# Patient Record
Sex: Female | Born: 1980 | Hispanic: Refuse to answer | Marital: Married | Smoking: Former smoker
Health system: Southern US, Community
[De-identification: ages and names within clinical notes are randomized; demographics above are authoritative.]

## PROBLEM LIST (undated history)

## (undated) DIAGNOSIS — F419 Anxiety disorder, unspecified: Secondary | ICD-10-CM

## (undated) DIAGNOSIS — F329 Major depressive disorder, single episode, unspecified: Secondary | ICD-10-CM

## (undated) DIAGNOSIS — O09299 Supervision of pregnancy with other poor reproductive or obstetric history, unspecified trimester: Secondary | ICD-10-CM

## (undated) DIAGNOSIS — F32A Depression, unspecified: Secondary | ICD-10-CM

## (undated) DIAGNOSIS — R011 Cardiac murmur, unspecified: Secondary | ICD-10-CM

## (undated) HISTORY — PX: CARPAL TUNNEL RELEASE: SHX101

## (undated) HISTORY — PX: APPENDECTOMY: SHX54

---
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
## 2020-03-01 LAB — GLUCOSE, FASTING
Glucose Fasting: 78
Hemoglobin A1C: 5
Lead: <1
Progesterone: 21.2
T3, Free: 5
T3, Total: 18.6
T4, Free Direct Dialysis: 2.9

## 2020-03-01 LAB — OB RESULTS CONSOLE RUBELLA ANTIBODY, IGM: Rubella: IMMUNE

## 2020-03-01 LAB — OB RESULTS CONSOLE HGB/HCT, BLOOD
HCT: 40 (ref 29–41)
Hemoglobin: 13.6

## 2020-03-01 LAB — OB RESULTS CONSOLE PLATELET COUNT: Platelets: 293

## 2020-03-01 LAB — OB RESULTS CONSOLE HIV ANTIBODY (ROUTINE TESTING): HIV: NONREACTIVE

## 2020-03-01 LAB — OB RESULTS CONSOLE TSH: TSH: 0.02

## 2020-03-01 LAB — OB RESULTS CONSOLE HEPATITIS B SURFACE ANTIGEN: Hepatitis B Surface Ag: NEGATIVE

## 2020-03-01 LAB — OB RESULTS CONSOLE ABO/RH: RH Type: POSITIVE

## 2020-03-01 LAB — OB RESULTS CONSOLE RPR: RPR: NONREACTIVE

## 2020-03-01 LAB — OB RESULTS CONSOLE VARICELLA ZOSTER ANTIBODY, IGG: Varicella: IMMUNE

## 2020-03-01 LAB — OB RESULTS CONSOLE ANTIBODY SCREEN: Antibody Screen: NEGATIVE

## 2020-04-29 LAB — OB RESULTS CONSOLE GC/CHLAMYDIA
Chlamydia: NEGATIVE
Gonorrhea: NEGATIVE

## 2020-06-06 ENCOUNTER — Encounter (HOSPITAL_COMMUNITY): Payer: Self-pay | Admitting: Obstetrics & Gynecology

## 2020-06-06 ENCOUNTER — Inpatient Hospital Stay (HOSPITAL_COMMUNITY)
Admission: EM | Admit: 2020-06-06 | Discharge: 2020-06-06 | Disposition: A | Discharge status: Home / Self Care | Payer: Medicaid Other | Attending: Obstetrics & Gynecology | Admitting: Obstetrics & Gynecology

## 2020-06-06 ENCOUNTER — Telehealth (INDEPENDENT_AMBULATORY_CARE_PROVIDER_SITE_OTHER): Payer: Medicaid Other | Admitting: Obstetrics and Gynecology

## 2020-06-06 ENCOUNTER — Other Ambulatory Visit: Payer: Self-pay

## 2020-06-06 ENCOUNTER — Encounter: Payer: Self-pay | Admitting: *Deleted

## 2020-06-06 DIAGNOSIS — O99342 Other mental disorders complicating pregnancy, second trimester: Secondary | ICD-10-CM | POA: Insufficient documentation

## 2020-06-06 DIAGNOSIS — O99891 Other specified diseases and conditions complicating pregnancy: Secondary | ICD-10-CM | POA: Diagnosis not present

## 2020-06-06 DIAGNOSIS — Z79899 Other long term (current) drug therapy: Secondary | ICD-10-CM | POA: Diagnosis not present

## 2020-06-06 DIAGNOSIS — O0990 Supervision of high risk pregnancy, unspecified, unspecified trimester: Secondary | ICD-10-CM

## 2020-06-06 DIAGNOSIS — O10919 Unspecified pre-existing hypertension complicating pregnancy, unspecified trimester: Secondary | ICD-10-CM

## 2020-06-06 DIAGNOSIS — Z3492 Encounter for supervision of normal pregnancy, unspecified, second trimester: Secondary | ICD-10-CM

## 2020-06-06 DIAGNOSIS — O0932 Supervision of pregnancy with insufficient antenatal care, second trimester: Secondary | ICD-10-CM | POA: Diagnosis not present

## 2020-06-06 DIAGNOSIS — M549 Dorsalgia, unspecified: Secondary | ICD-10-CM | POA: Diagnosis not present

## 2020-06-06 DIAGNOSIS — O09299 Supervision of pregnancy with other poor reproductive or obstetric history, unspecified trimester: Secondary | ICD-10-CM

## 2020-06-06 DIAGNOSIS — O368120 Decreased fetal movements, second trimester, not applicable or unspecified: Secondary | ICD-10-CM

## 2020-06-06 DIAGNOSIS — Z3A22 22 weeks gestation of pregnancy: Secondary | ICD-10-CM | POA: Diagnosis not present

## 2020-06-06 DIAGNOSIS — O26892 Other specified pregnancy related conditions, second trimester: Secondary | ICD-10-CM | POA: Insufficient documentation

## 2020-06-06 DIAGNOSIS — O09292 Supervision of pregnancy with other poor reproductive or obstetric history, second trimester: Secondary | ICD-10-CM | POA: Diagnosis not present

## 2020-06-06 DIAGNOSIS — B3731 Acute candidiasis of vulva and vagina: Secondary | ICD-10-CM

## 2020-06-06 DIAGNOSIS — O99322 Drug use complicating pregnancy, second trimester: Secondary | ICD-10-CM

## 2020-06-06 DIAGNOSIS — F419 Anxiety disorder, unspecified: Secondary | ICD-10-CM | POA: Insufficient documentation

## 2020-06-06 DIAGNOSIS — O98812 Other maternal infectious and parasitic diseases complicating pregnancy, second trimester: Secondary | ICD-10-CM | POA: Insufficient documentation

## 2020-06-06 DIAGNOSIS — M545 Low back pain: Secondary | ICD-10-CM | POA: Insufficient documentation

## 2020-06-06 DIAGNOSIS — B373 Candidiasis of vulva and vagina: Secondary | ICD-10-CM | POA: Diagnosis not present

## 2020-06-06 DIAGNOSIS — Z87891 Personal history of nicotine dependence: Secondary | ICD-10-CM | POA: Insufficient documentation

## 2020-06-06 DIAGNOSIS — F329 Major depressive disorder, single episode, unspecified: Secondary | ICD-10-CM | POA: Diagnosis not present

## 2020-06-06 DIAGNOSIS — Z8759 Personal history of other complications of pregnancy, childbirth and the puerperium: Secondary | ICD-10-CM | POA: Insufficient documentation

## 2020-06-06 DIAGNOSIS — O368130 Decreased fetal movements, third trimester, not applicable or unspecified: Secondary | ICD-10-CM | POA: Diagnosis not present

## 2020-06-06 DIAGNOSIS — O09522 Supervision of elderly multigravida, second trimester: Secondary | ICD-10-CM

## 2020-06-06 HISTORY — DX: Depression, unspecified: F32.A

## 2020-06-06 HISTORY — DX: Supervision of pregnancy with other poor reproductive or obstetric history, unspecified trimester: O09.299

## 2020-06-06 HISTORY — DX: Cardiac murmur, unspecified: R01.1

## 2020-06-06 HISTORY — DX: Anxiety disorder, unspecified: F41.9

## 2020-06-06 LAB — URINALYSIS, ROUTINE W REFLEX MICROSCOPIC
Bacteria, UA: NONE SEEN
Bilirubin Urine: NEGATIVE
Glucose, UA: NEGATIVE mg/dL
Ketones, ur: NEGATIVE mg/dL
Leukocytes,Ua: NEGATIVE
Nitrite: NEGATIVE
Protein, ur: NEGATIVE mg/dL
Specific Gravity, Urine: 1.016 (ref 1.005–1.030)
pH: 6 (ref 5.0–8.0)

## 2020-06-06 LAB — WET PREP, GENITAL
Clue Cells Wet Prep HPF POC: NONE SEEN
Sperm: NONE SEEN
Trich, Wet Prep: NONE SEEN

## 2020-06-06 MED ORDER — TERCONAZOLE 0.4 % VA CREA: 1.0000 | TOPICAL_CREAM | Freq: Every day | VAGINAL | 0 refills | Status: AC

## 2020-06-06 MED ORDER — COMFORT FIT MATERNITY SUPP SM MISC: 1.0000 [IU] | Freq: Every day | 0 refills | Status: AC | PRN

## 2020-06-06 MED ORDER — ACETAMINOPHEN 500 MG PO TABS
1000.0000 mg | ORAL_TABLET | Freq: Once | ORAL | Status: AC
  Administered 2020-06-06: 1000 mg via ORAL
  Filled 2020-06-06: qty 2

## 2020-06-06 MED ORDER — CYCLOBENZAPRINE HCL 5 MG PO TABS
10.0000 mg | ORAL_TABLET | Freq: Once | ORAL | Status: AC
  Administered 2020-06-06: 10 mg via ORAL
  Filled 2020-06-06: qty 2

## 2020-06-06 MED ORDER — CYCLOBENZAPRINE HCL 5 MG PO TABS: 5.0000 mg | ORAL_TABLET | Freq: Three times a day (TID) | ORAL | 0 refills | Status: AC | PRN

## 2020-06-06 NOTE — ED Notes (Signed)
Patient is scared to sit inside ED waiting area. Informed her that transport will be arriving momentarily and if she is outside she will miss her name being called.... Patient states she would like to "check herself out" of the ED and walk over to MAU. Advised patient against this.... Patient is inside ED lobby and is waiting for transport.

## 2020-06-06 NOTE — MAU Provider Note (Signed)
Chief Complaint: Decreased Fetal Movement and Back Pain   First Provider Initiated Contact with Patient 06/06/20 2119     SUBJECTIVE HPI: Yvette Murphy is a 39 y.o. [id number] at [redacted]w[redacted]d who presents to Maternity Admissions reporting decreased fetal movement and back pain.  Recently moved here from [place] and has an appointment with her new OB later this month. Normally feels her baby every time she eats, but has not felt movement at all today.  Also reports constant low back pain today.  Has noticed an increase in discharge that looks like yeast to her.  No vaginal irritation.  Denies urinary complaints or vaginal bleeding.  Location: back Quality: aching Severity: 4/10 on pain scale Duration: 1 day Timing: constant Modifying factors: none Associated signs and symptoms: vaginal discharge  Past Medical History:  Diagnosis Date  . Anxiety and depression   . Heart murmur   . Hx of preeclampsia, prior pregnancy, currently pregnant    OB History  Gravida Para Term Preterm AB Living  5 3 3  0 1 3  SAB TAB Ectopic Multiple Live Births  1 0 0 0 3    # Outcome Date GA Lbr Len/2nd Weight Sex Delivery Anes PTL Lv  5 Current           4 SAB  [redacted]w[redacted]d         3 Term      Vag-Spont     2 Term      Vag-Spont     1 Term      Vag-Spont      Past Surgical History:  Procedure Laterality Date  . APPENDECTOMY    . CARPAL TUNNEL RELEASE Right    Social History   Socioeconomic History  . Marital status: Married    Spouse name: Not on file  . Number of children: Not on file  . Years of education: Not on file  . Highest education level: Not on file  Occupational History  . Not on file  Tobacco Use  . Smoking status: Former [redacted]w[redacted]d  . Smokeless tobacco: Never Used  Substance and Sexual Activity  . Alcohol use: Not Currently    Comment: socially  . Drug use: Not Currently    Types: Marijuana    Comment: used medical marijuana prior to pregnancy  . Sexual activity: Not Currently  Other Topics  Concern  . Not on file  Social History Narrative  . Not on file   Social Determinants of Health   Financial Resource Strain:   . Difficulty of Paying Living Expenses:   Food Insecurity:   . Worried About Games developer in the Last Year:   . Programme researcher, broadcasting/film/video in the Last Year:   Transportation Needs:   . Barista (Medical):   Freight forwarder Lack of Transportation (Non-Medical):   Physical Activity:   . Days of Exercise per Week:   . Minutes of Exercise per Session:   Stress:   . Feeling of Stress :   Social Connections:   . Frequency of Communication with Friends and Family:   . Frequency of Social Gatherings with Friends and Family:   . Attends Religious Services:   . Active Member of Clubs or Organizations:   . Attends Marland Kitchen Meetings:   Banker Marital Status:   Intimate Partner Violence:   . Fear of Current or Ex-Partner:   . Emotionally Abused:   Marland Kitchen Physically Abused:   . Sexually Abused:    Family  History  Problem Relation Age of Onset  . Diabetes Father   . CVA Father   . Hypertension Father    No current facility-administered medications on file prior to encounter.   Current Outpatient Medications on File Prior to Encounter  Medication Sig Dispense Refill  . acetaminophen (TYLENOL) 500 MG tablet Take 1,000 mg by mouth every 6 (six) hours as needed.    Marland Kitchen buPROPion (WELLBUTRIN XL) 150 MG 24 hr tablet Take 150 mg by mouth daily.    Marland [employer] doxylamine, Sleep, (UNISOM) 25 MG tablet Take 25 mg by mouth at bedtime as needed.    . Prenatal Vit-Fe Fumarate-FA (PRENATAL MULTIVITAMIN) TABS tablet Take 1 tablet by mouth daily at 12 noon.    . pyridOXINE (VITAMIN B-6) 100 MG tablet Take 100 mg by mouth daily.     Allergies  Allergen Reactions  . Codeine Hives    I have reviewed patient's Past Medical Hx, Surgical Hx, Family Hx, Social Hx, medications and allergies.   Review of Systems  Constitutional: Negative.   Gastrointestinal: Negative.   Genitourinary:  Positive for vaginal discharge. Negative for dysuria, flank pain and vaginal bleeding.  Musculoskeletal: Positive for back pain.    OBJECTIVE Patient Vitals for the past 24 hrs:  BP Temp Temp src Pulse Resp SpO2 Height Weight  06/06/20 2237 96/72 -- -- 86 18 -- -- --  06/06/20 2017 107/77 98.5 F (36.9 C) Oral 94 18 100 % 5\' 1"  (1.549 m) 69.9 kg  06/06/20 1840 114/81 98.2 F (36.8 C) -- 97 18 98 % -- --   Constitutional: Well-developed, well-nourished female in no acute distress.  Cardiovascular: normal rate & rhythm, no murmur Respiratory: normal rate and effort. Lung sounds clear throughout GI: No CVAT.  Abd soft, non-tender, Pos BS x 4. No guarding or rebound tenderness MS: Extremities nontender, no edema, normal ROM Neurologic: Alert and oriented x 4.  GU:  NEFG. Clumpy white discharge. No blood. Cervix closed/thick.    LAB RESULTS Results for orders placed or performed during the hospital encounter of 06/06/20 (from the past 24 hour(s))  Urinalysis, Routine w reflex microscopic     Status: Abnormal   Collection Time: 06/06/20  8:46 PM  Result Value Ref Range   Color, Urine YELLOW YELLOW   APPearance CLEAR CLEAR   Specific Gravity, Urine 1.016 1.005 - 1.030   pH 6.0 5.0 - 8.0   Glucose, UA NEGATIVE NEGATIVE mg/dL   Hgb urine dipstick MODERATE (A) NEGATIVE   Bilirubin Urine NEGATIVE NEGATIVE   Ketones, ur NEGATIVE NEGATIVE mg/dL   Protein, ur NEGATIVE NEGATIVE mg/dL   Nitrite NEGATIVE NEGATIVE   Leukocytes,Ua NEGATIVE NEGATIVE   RBC / HPF 21-50 0 - 5 RBC/hpf   WBC, UA 0-5 0 - 5 WBC/hpf   Bacteria, UA NONE SEEN NONE SEEN   Squamous Epithelial / LPF 0-5 0 - 5   Mucus PRESENT   Wet prep, genital     Status: Abnormal   Collection Time: 06/06/20  9:36 PM   Specimen: Vaginal  Result Value Ref Range   Yeast Wet Prep HPF POC PRESENT (A) NONE SEEN   Trich, Wet Prep NONE SEEN NONE SEEN   Clue Cells Wet Prep HPF POC NONE SEEN NONE SEEN   WBC, Wet Prep HPF POC MANY (A) NONE  SEEN   Sperm NONE SEEN     IMAGING No results found.  MAU COURSE Orders Placed This Encounter  Procedures  . Culture, OB Urine  . Wet prep,  genital  . Urinalysis, Routine w reflex microscopic  . POC urine preg, ED  . Discharge patient   Meds ordered this encounter  Medications  . acetaminophen (TYLENOL) tablet 1,000 mg  . cyclobenzaprine (FLEXERIL) tablet 10 mg  . terconazole (TERAZOL 7) 0.4 % vaginal cream    Sig: Place 1 applicator vaginally at bedtime. Use for seven days    Dispense:  45 g    Refill:  0    Order Specific Question:   Supervising Provider    Answer:   Adam PhenixARNOLD, JAMES G [[id number]]  . cyclobenzaprine (FLEXERIL) 5 MG tablet    Sig: Take 1 tablet (5 mg total) by mouth 3 (three) times daily as needed (back pain).    Dispense:  20 tablet    Refill:  0    Order Specific Question:   Supervising Provider    Answer:   Adam PhenixARNOLD, JAMES G [[id number]]  . Elastic Bandages & Supports (COMFORT FIT MATERNITY SUPP SM) MISC    Sig: 1 Units by Does not apply route daily as needed.    Dispense:  1 each    Refill:  0    Order Specific Question:   Supervising Provider    Answer:   Adam PhenixARNOLD, JAMES G [[id number]]    MDM FHT present via doppler & patient endorses movement in MAU. Pt reassured.   Low back pain & pelvic pressure. Cervix closed/thick. No CVA tenderness or flank pain. U/a with hemoglobin, no leuks. Will culture.  Wet prep collected & is positive for yeast; will rx terazol.   Tylenol, flexeril, & hot pack given for back pain. Likely MSK. Recommend maternity support belt & given rx to [facility] one at [facility].   ASSESSMENT 1. Back pain affecting pregnancy in second trimester   2. Vaginal yeast infection   3. Decreased fetal movements in second trimester, single or unspecified fetus   4. [redacted] weeks gestation of pregnancy   5. Presence of fetal heart sounds in second trimester     PLAN Discharge home in stable condition. Rx flexeril, terazol, & maternity support belt  GC/CT & urine  culture pending Discussed reasons to return to MAU Informed of upcoming appointments   Follow-up Information    [facility] Follow up.   Why: go to get maternity support belt Contact information: [address] [address] [phone]              Allergies as of 06/06/2020      Reactions   Codeine Hives      Medication List    TAKE these medications   acetaminophen 500 MG tablet Commonly known as: TYLENOL Take 1,000 mg by mouth every 6 (six) hours as needed.   buPROPion 150 MG 24 hr tablet Commonly known as: WELLBUTRIN XL Take 150 mg by mouth daily.   Comfort Fit Maternity Supp Sm Misc 1 Units by Does not apply route daily as needed.   cyclobenzaprine 5 MG tablet Commonly known as: FLEXERIL Take 1 tablet (5 mg total) by mouth 3 (three) times daily as needed (back pain).   doxylamine (Sleep) 25 MG tablet Commonly known as: UNISOM Take 25 mg by mouth at bedtime as needed.   prenatal multivitamin Tabs tablet Take 1 tablet by mouth daily at 12 noon.   pyridOXINE 100 MG tablet Commonly known as: VITAMIN B-6 Take 100 mg by mouth daily.   terconazole 0.4 % vaginal cream Commonly known as: TERAZOL 7 Place 1 applicator vaginally  at bedtime. Use for seven days        Judeth Horn, NP 06/06/2020  11:56 PM

## 2020-06-06 NOTE — Telephone Encounter (Addendum)
Called pt to discuss her request and she did not answer. Message left on VM stating that I will call back. Per review, pt is a transfer of care and is scheduled for first office appt on 06/21/20. Records have been received and indicate pt has CHTN and substance abuse. EDD 10/07/20 by LMP 01/01/20 (now 22w 3d). [place] for anatomy ordered and scheduled on 8/5 @ 10:15.    8/26  1710  It was brought to my attention that pt had raised concerns regarding incorrect information contained in her record that she had history of substance abuse. (See my note on 8/2) Per extensive review of all records available, the history of substance abuse as well as CHTN could not be found. I called pt and extended my most sincere apology for this incorrect information in her record. I assured her that the information could not be found in the records we received from her previous provider. I also informed her that I would be adding this note stating that she does not have substance abuse or CHTN. Pt accepted my apology with grace and thanked me for reaching out to her regarding the situation. Pt added that she has moved back to [place] and will resume prenatal care with her previous provider. I advised that she may contact us if she has any additional questions or concerns. She voiced understanding.

## 2020-06-06 NOTE — MAU Note (Signed)
Pt transferred from [facility] with reports of decreased fetal movement, back pain, and pelvic pressure. Reports she hasn't felt baby move today. Tried eating and nothing. Reports back pain started around 3pm, pain is constant and cannot get comfortable. Denies urinary s/s, vaginal bleeding, LOF. Has some discharge, possible yeast infection.

## 2020-06-06 NOTE — Discharge Instructions (Signed)

## 2020-06-06 NOTE — ED Triage Notes (Signed)
Approx [redacted] weeks pregnant, EDD 10/07/2020. Reports dec fetal activity

## 2020-06-06 NOTE — Telephone Encounter (Signed)
Patient is scheduled for her first OB appointment on Aug 17th, patient is requesting a order to get her first Ultra Sound

## 2020-06-06 NOTE — ED Provider Notes (Signed)
MSE was initiated and I personally evaluated the patient and placed orders (if any) at  6:51 PM on June 06, 2020.  The patient appears stable so that the remainder of the MSE may be completed by another provider.  Patient placed in Quick Look pathway, seen and evaluated   Chief Complaint: Back pain and decreased fetal activity  HPI:   38 year old female who is currently pregnant at [redacted] weeks gestation based on LMP presenting to the ED for back pain, pressure sensation abdomen and decreased fetal activity today.  She recently moved from [place] to [place] and has not yet established prenatal care with an OB/GYN.  She denies any leakage of fluids, vaginal bleeding, chest pain, shortness of breath, injuries or falls.  ROS: Abdominal pressure (one)  Physical Exam:   Gen: No distress  Neuro: Awake and Alert  Skin: Warm    Focused Exam: No focal tenderness of the abdomen.  7:32 PM Patient concern for decreased fetal activity, back pain abdominal pressure today.  She is currently [redacted] weeks pregnant. HDS in triage. No documented pregnancy test in the system as she recently moved to [place] from [place].  POC urine pregnancy test ordered. MAU provider notified of transfer from ED.   Initiation of care has begun. The patient has been counseled on the process, plan, and necessity for staying for the completion/evaluation, and the remainder of the medical screening examination    Dietrich Pates, PA-C 06/06/20 1932    Jacalyn Lefevre, MD 06/06/20 [phone]

## 2020-06-06 NOTE — ED Notes (Signed)
Report called to MAU. Transport arranged

## 2020-06-07 LAB — GC/CHLAMYDIA PROBE AMP (~~LOC~~) NOT AT ARMC
Chlamydia: NEGATIVE
Comment: NEGATIVE
Comment: NORMAL
Neisseria Gonorrhea: NEGATIVE

## 2020-06-07 NOTE — Telephone Encounter (Signed)
Called patient & informed her of ultrasound appt. Patient verbalized understanding & states she was aware of the appt but wondered about a later time. Provided phone number to MFM for patient to call to reschedule. Patient asked about what papers to bring to the appt. Recommended driver's license and insurance card. Patient verbalized understanding.

## 2020-06-08 LAB — CULTURE, OB URINE

## 2020-06-09 ENCOUNTER — Ambulatory Visit: Payer: Medicaid Other | Attending: Obstetrics and Gynecology

## 2020-06-09 ENCOUNTER — Other Ambulatory Visit: Payer: Self-pay

## 2020-06-09 ENCOUNTER — Encounter: Payer: Self-pay | Admitting: *Deleted

## 2020-06-09 ENCOUNTER — Ambulatory Visit: Payer: Medicaid Other | Admitting: *Deleted

## 2020-06-09 ENCOUNTER — Other Ambulatory Visit: Payer: Self-pay | Admitting: *Deleted

## 2020-06-09 VITALS — BP 115/77 | HR 101

## 2020-06-09 DIAGNOSIS — O99322 Drug use complicating pregnancy, second trimester: Secondary | ICD-10-CM | POA: Insufficient documentation

## 2020-06-09 DIAGNOSIS — O09529 Supervision of elderly multigravida, unspecified trimester: Secondary | ICD-10-CM

## 2020-06-09 DIAGNOSIS — O0990 Supervision of high risk pregnancy, unspecified, unspecified trimester: Secondary | ICD-10-CM | POA: Diagnosis present

## 2020-06-09 DIAGNOSIS — O09522 Supervision of elderly multigravida, second trimester: Secondary | ICD-10-CM

## 2020-06-09 DIAGNOSIS — O09292 Supervision of pregnancy with other poor reproductive or obstetric history, second trimester: Secondary | ICD-10-CM

## 2020-06-09 DIAGNOSIS — Z362 Encounter for other antenatal screening follow-up: Secondary | ICD-10-CM

## 2020-06-09 DIAGNOSIS — O10919 Unspecified pre-existing hypertension complicating pregnancy, unspecified trimester: Secondary | ICD-10-CM | POA: Insufficient documentation

## 2020-06-09 DIAGNOSIS — Z8759 Personal history of other complications of pregnancy, childbirth and the puerperium: Secondary | ICD-10-CM | POA: Diagnosis not present

## 2020-06-09 DIAGNOSIS — Z363 Encounter for antenatal screening for malformations: Secondary | ICD-10-CM

## 2020-06-09 DIAGNOSIS — O322XX0 Maternal care for transverse and oblique lie, not applicable or unspecified: Secondary | ICD-10-CM

## 2020-06-09 DIAGNOSIS — Z3A22 22 weeks gestation of pregnancy: Secondary | ICD-10-CM

## 2020-06-13 ENCOUNTER — Encounter: Payer: Self-pay | Admitting: Lactation Services

## 2020-06-13 DIAGNOSIS — Z3402 Encounter for supervision of normal first pregnancy, second trimester: Secondary | ICD-10-CM | POA: Insufficient documentation

## 2020-06-13 DIAGNOSIS — O09522 Supervision of elderly multigravida, second trimester: Secondary | ICD-10-CM | POA: Insufficient documentation

## 2020-06-21 ENCOUNTER — Telehealth: Payer: Self-pay | Admitting: Obstetrics and Gynecology

## 2020-06-21 ENCOUNTER — Encounter: Payer: Self-pay | Admitting: Obstetrics and Gynecology

## 2020-06-21 NOTE — Telephone Encounter (Signed)
Spoke with patient to get her rescheduled for her missed new ob appointment. Patient stated to cancel the appointment and she wishes not to be rescheduled. Per patient request appointment was not rescheduled.

## 2020-06-27 ENCOUNTER — Encounter: Payer: Self-pay | Admitting: *Deleted

## 2020-06-27 ENCOUNTER — Other Ambulatory Visit: Payer: Self-pay | Admitting: Obstetrics and Gynecology

## 2020-06-27 DIAGNOSIS — O10919 Unspecified pre-existing hypertension complicating pregnancy, unspecified trimester: Secondary | ICD-10-CM

## 2020-06-27 DIAGNOSIS — O0990 Supervision of high risk pregnancy, unspecified, unspecified trimester: Secondary | ICD-10-CM

## 2020-07-07 ENCOUNTER — Ambulatory Visit: Payer: Medicaid Other

## 2021-09-22 IMAGING — US US MFM OB DETAIL+14 WK
1 series · 13 of 28 positions shown · non-contrast
Comparison: none

[Series 1: us mfm ob detail+14 wk · 96 acquisitions, 13 frames shown]
[im 4/96]
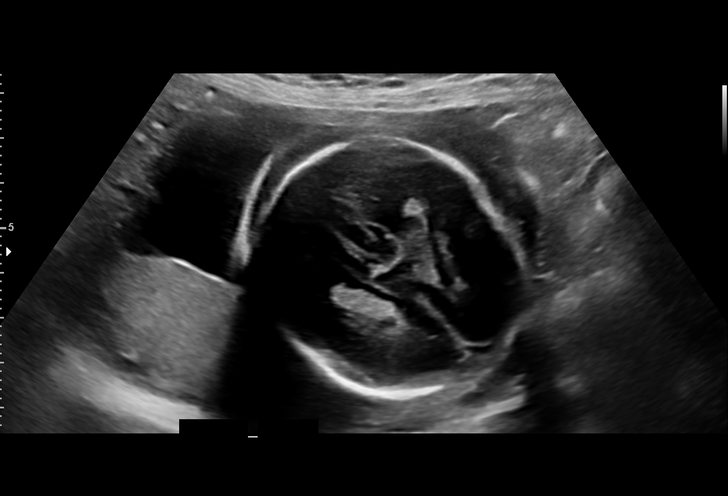
[im 11/96]
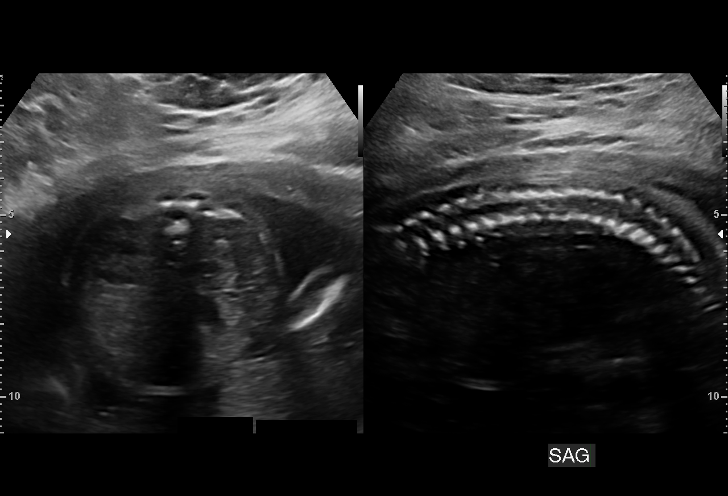
[im 18/96]
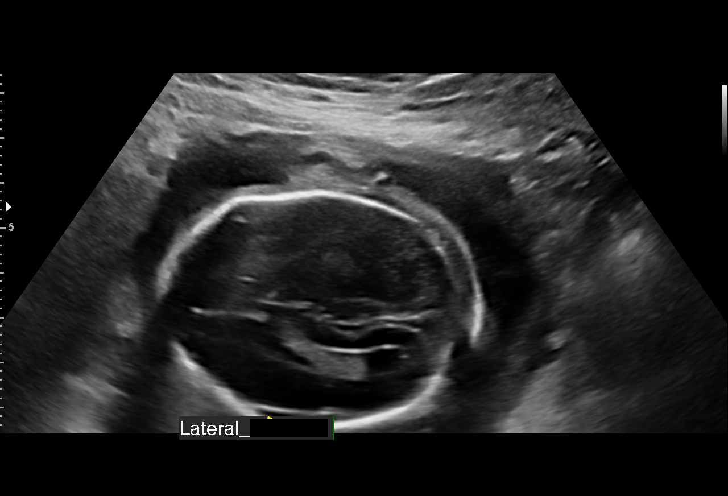
[im 25/96]
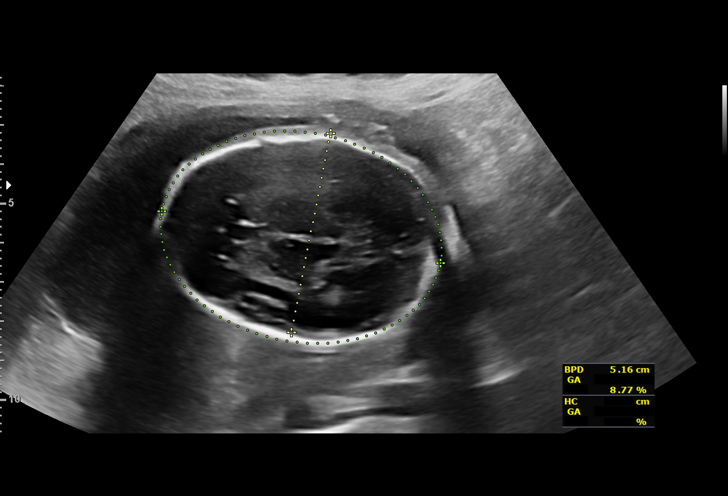
[im 32/96]
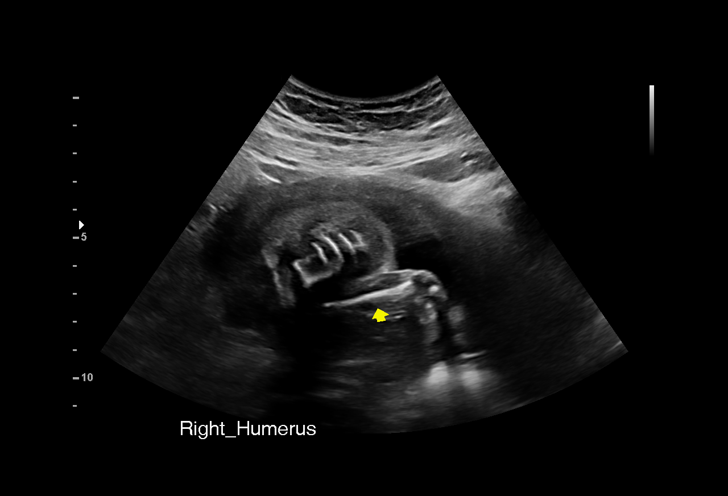
[im 39/96]
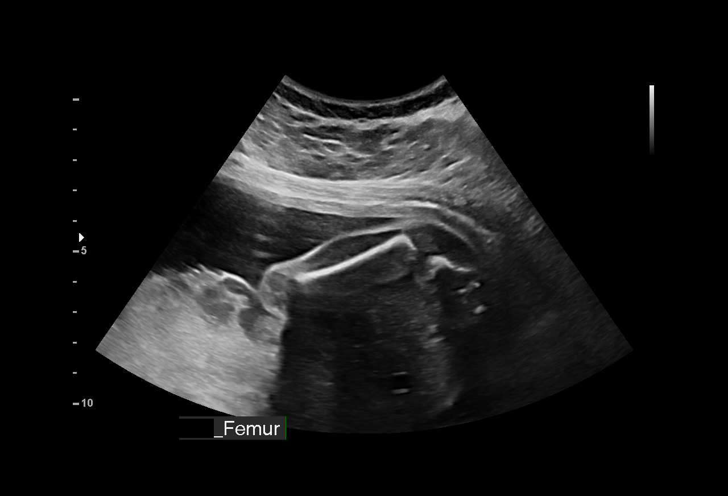
[im 50/96]
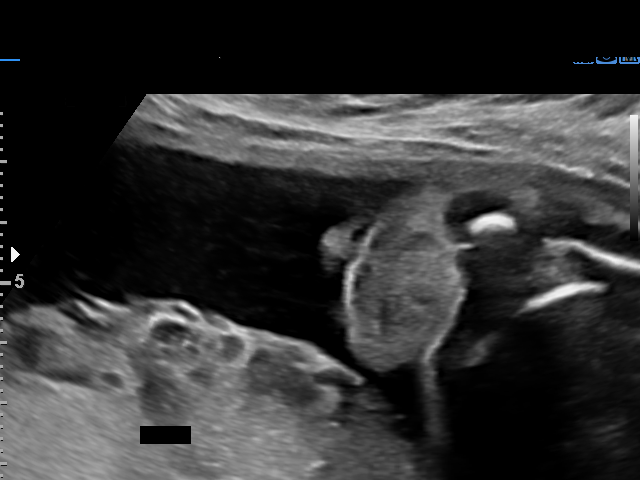
[im 57/96]
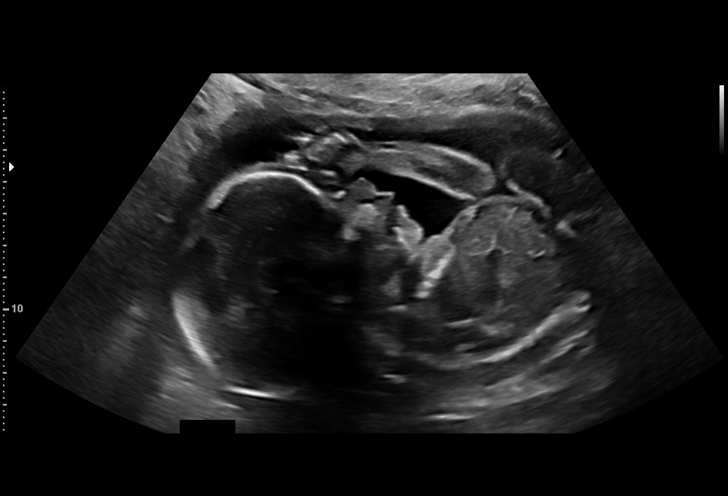
[im 64/96]
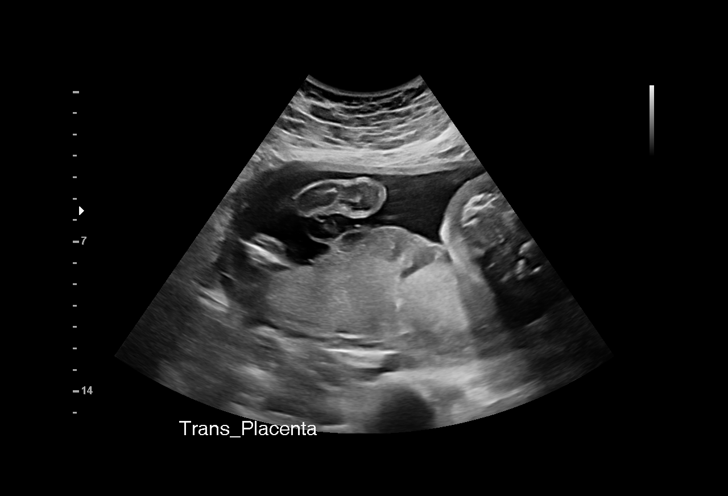
[im 71/96]
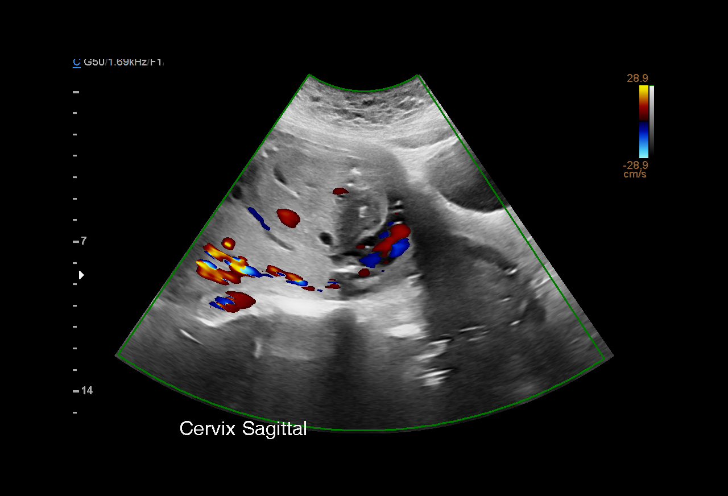
[im 78/96]
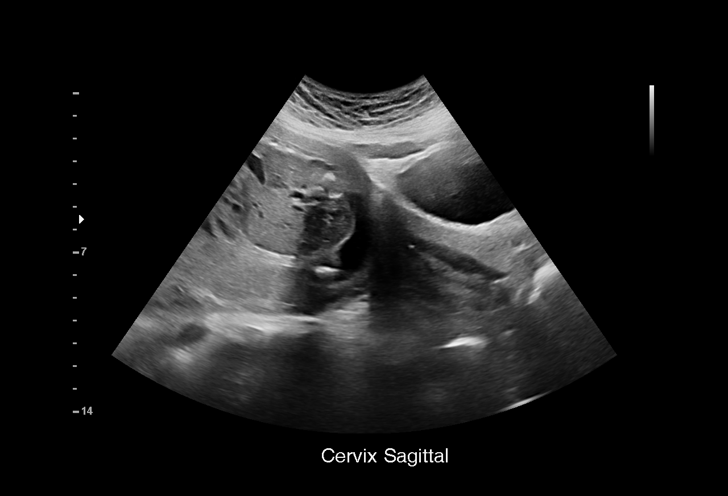
[im 85/96]
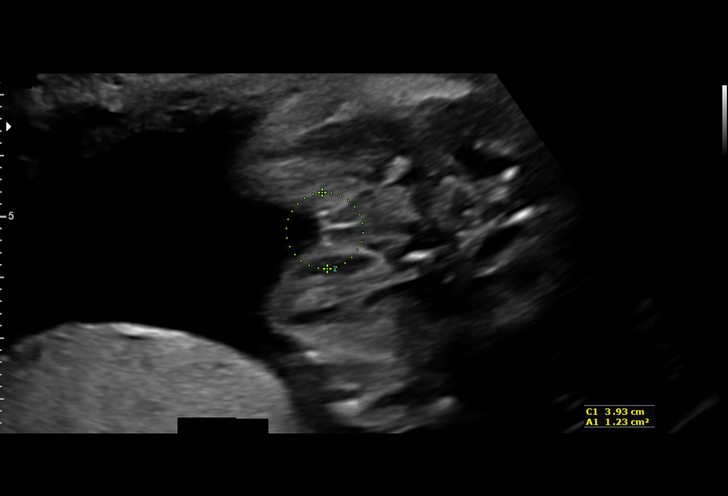
[im 92/96]
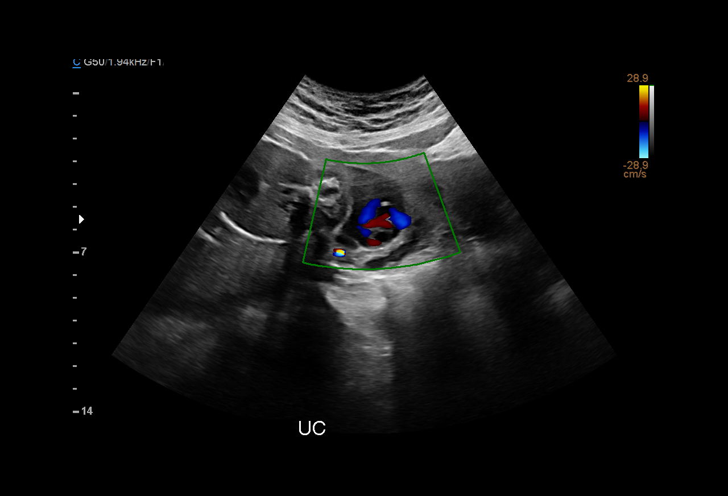

[13 of 28 positions shown; findings below may reference images not displayed]

[REDACTED]care

Indications

 Poor obstetric history: Previous
 preeclampsia / eclampsia/gestational HTN
 Encounter for antenatal screening for
 malformations
 22 weeks gestation of pregnancy
 Advanced maternal age multigravida 35+,
 second trimester
 Neg NIPS, neg AFP
Fetal Evaluation

 Num Of Fetuses:         1
 Fetal Heart Rate(bpm):  164
 Cardiac Activity:       Observed
 Presentation:           Transverse, head to maternal left
 Placenta:               Posterior
 P. Cord Insertion:      Visualized

 Amniotic Fluid
 AFI FV:      Within normal limits

                             Largest Pocket(cm)

Biometry

 BPD:      51.2  mm     G. Age:  21w 4d          7  %    CI:        68.06   %    70 - 86
                                                         FL/HC:      20.5   %    19.2 -
 HC:      198.5  mm     G. Age:  22w 0d         10  %    HC/AC:      0.99        1.05 -
 AC:      199.7  mm     G. Age:  24w 4d         90  %    FL/BPD:     79.3   %    71 - 87
 FL:       40.6  mm     G. Age:  23w 1d         48  %    FL/AC:      20.3   %    20 - 24
 HUM:      36.7  mm     G. Age:  22w 6d         42  %
 CER:      23.4  mm     G. Age:  21w 5d         40  %

 LV:        4.9  mm
 CM:        5.6  mm

 Est. FW:     615  gm      1 lb 6 oz     81  %
OB History

 Gravidity:    5         Term:   3         SAB:   1
 Living:       3
Gestational Age

 LMP:           22w 6d        Date:  01/01/20                 EDD:   10/07/20
 U/S Today:     22w 6d                                        EDD:   10/07/20
 Best:          22w 6d     Det. By:  LMP  (01/01/20)          EDD:   10/07/20
Anatomy

 Cranium:               Appears normal         LVOT:                   Appears normal
 Cavum:                 Appears normal         Aortic Arch:            Not well visualized
 Ventricles:            Appears normal         Ductal Arch:            Not well visualized
 Choroid Plexus:        Appears normal         Diaphragm:              Not well visualized
 Cerebellum:            Appears normal         Stomach:                Appears normal, left
                                                                       sided
 Posterior Fossa:       Appears normal         Abdomen:                Appears normal
 Nuchal Fold:           Not applicable (>20    Abdominal Wall:         Appears nml (cord
                        wks GA)                                        insert, abd wall)
 Face:                  Appears normal         Cord Vessels:           Appears normal (3
                        (orbits and profile)                           vessel cord)
 Lips:                  Appears normal         Kidneys:                Appear normal
 Palate:                Not well visualized    Bladder:                Appears normal
 Thoracic:              Appears normal         Spine:                  Not well visualized
 Heart:                 Appears normal         Upper Extremities:      Appears normal
                        (4CH, axis, and
                        situs)
 RVOT:                  Not well visualized    Lower Extremities:      Appears normal

 Other:  Fetus appears to be female. Heels visualized. Hands not well
         visualized. Nasal bone visualized. Technically difficult due to maternal
         habitus and fetal position.
Cervix Uterus Adnexa

 Cervix
 Length:           4.88  cm.
 Normal appearance by transabdominal scan.

 Uterus
 No abnormality visualized.

 Right Ovary
 Within normal limits.
 Left Ovary
 Within normal limits.
Impression

 G5 P3.  Patient is here for fetal anatomy scan.  Obstetric
 history is significant for previous 3 term vaginal deliveries.
 Her most recent pregnancy was complicated by
 preeclampsia.  Patient reports she takes low-dose aspirin
 prophylaxis.
 MSAFP screening showed low risk for open-neural tube
 defects .
 On cell-free fetal DNA screening, the risks of fetal
 aneuploidies are not increased .

 We performed fetal anatomy scan. No makers of
 aneuploidies or fetal structural defects are seen. Fetal
 biometry is consistent with her previously-established dates.
 Amniotic fluid is normal and good fetal activity is seen.  Free
 loops of umbilical cord was seen below the presenting part
 and overlying the cervix.  It is not consistent with vasa previa.
 I explained the findings and reassured her.
 Patient understands the limitations of ultrasound in detecting
 fetal anomalies.
Recommendations

 -An appointment was made for her to return in 4 weeks for
 completion of fetal anatomy.
 -If umbilical cord is seen below the presenting part and
 overlying the cervix, transvaginal ultrasound should be
 performed.
                      Urda, Martin Ariel
